# Patient Record
Sex: Female | Born: 1985 | Hispanic: Yes | Marital: Single | Smoking: Former smoker
Health system: Southern US, Community
[De-identification: ages and names within clinical notes are randomized; demographics above are authoritative.]

## PROBLEM LIST (undated history)

## (undated) ENCOUNTER — Ambulatory Visit: Admission: EM | Payer: Medicaid Other | Source: Home / Self Care

## (undated) DIAGNOSIS — N83209 Unspecified ovarian cyst, unspecified side: Secondary | ICD-10-CM

## (undated) HISTORY — PX: APPENDECTOMY: SHX54

---

## 2020-02-24 ENCOUNTER — Other Ambulatory Visit: Payer: Self-pay

## 2020-02-24 ENCOUNTER — Encounter: Payer: Self-pay | Admitting: Emergency Medicine

## 2020-02-24 ENCOUNTER — Ambulatory Visit
Admission: EM | Admit: 2020-02-24 | Discharge: 2020-02-24 | Disposition: A | Discharge status: Home / Self Care | Payer: Medicaid Other | Attending: Physician Assistant | Admitting: Physician Assistant

## 2020-02-24 DIAGNOSIS — N10 Acute tubulo-interstitial nephritis: Secondary | ICD-10-CM | POA: Insufficient documentation

## 2020-02-24 HISTORY — DX: Unspecified ovarian cyst, unspecified side: N83.209

## 2020-02-24 LAB — POCT URINALYSIS DIP (MANUAL ENTRY)
Bilirubin, UA: NEGATIVE
Glucose, UA: NEGATIVE mg/dL
Ketones, POC UA: NEGATIVE mg/dL
Nitrite, UA: POSITIVE — AB
Protein Ur, POC: 100 mg/dL — AB
Spec Grav, UA: 1.025 (ref 1.010–1.025)
Urobilinogen, UA: 0.2 E.U./dL
pH, UA: 7 (ref 5.0–8.0)

## 2020-02-24 LAB — POCT URINE PREGNANCY: Preg Test, Ur: NEGATIVE

## 2020-02-24 MED ORDER — KETOROLAC TROMETHAMINE 15 MG/ML IJ SOLN
15.0000 mg | Freq: Once | INTRAMUSCULAR | Status: AC
  Administered 2020-02-24: 15 mg via INTRAMUSCULAR

## 2020-02-24 MED ORDER — CEPHALEXIN 500 MG PO CAPS
500.0000 mg | ORAL_CAPSULE | Freq: Two times a day (BID) | ORAL | 0 refills | Status: DC
Start: 2020-02-24 — End: 2020-11-06

## 2020-02-24 NOTE — ED Triage Notes (Signed)
Abdominal pain started 5/15.  Patient has had diarrhea for 3-4 days.    Miscarriage while in [place]-05/2019 or 06/2019  Patient reports no period for the past 2 months-that episode was only one - two days of bleeding.    Denies burning with urination.    Home pregnancy tests are all negative.  Patient has pain in right lower back/flank

## 2020-02-24 NOTE — ED Provider Notes (Signed)
[facility]    CSN: [id number] Arrival date & time: 02/24/20  1146      History   Chief Complaint Chief Complaint  Patient presents with  . Abdominal Pain    HPI Grace Gray is a 34 y.o. female.   34 year old female comes in 1 month history of urinary symptoms, now with 4 day history of right back/abdominal pain. Has had urinary frequency, urgency without dysuria or hematuria.  Now with right flank pain that radiates to RLQ/suprapubic area.  Denies fever, chills, body aches.  Has had some diarrhea.  Denies nausea, vomiting.  Denies fever, chills, body aches.  Minimal vaginal discharge.  Denies itching.  Sexually active with one female partner, no condom use, no other birth control use. History of miscarriage 05/2019, has had irregular cycles since.      Past Medical History:  Diagnosis Date  . Ovarian cyst     There are no problems to display for this patient.   History reviewed. No pertinent surgical history.  OB History   No obstetric history on file.      Home Medications    Prior to Admission medications   Medication Sig Start Date End Date Taking? Authorizing Provider  cephALEXin (KEFLEX) 500 MG capsule Take 1 capsule (500 mg total) by mouth 2 (two) times daily. 02/24/20   Ok Edwards, PA-C    Family History History reviewed. No pertinent family history.  Social History Social History   Tobacco Use  . Smoking status: Not on file  Substance Use Topics  . Alcohol use: Not on file  . Drug use: Not on file     Allergies   Patient has no known allergies.   Review of Systems Review of Systems  Reason unable to perform ROS: See HPI as above.     Physical Exam Triage Vital Signs ED Triage Vitals  Enc Vitals Group     BP 02/24/20 1205 (!) 116/57     Pulse Rate 02/24/20 1205 92     Resp 02/24/20 1205 18     Temp 02/24/20 1205 98.6 F (37 C)     Temp Source 02/24/20 1205 Oral     SpO2 02/24/20 1205 98 %     Weight --    Height --      Head Circumference --      Peak Flow --      Pain Score 02/24/20 1220 8     Pain Loc --      Pain Edu? --      Excl. in [place]? --    No data found.  Updated Vital Signs BP (!) 116/57 (BP Location: Left Arm)   Pulse 92   Temp 98.6 F (37 C) (Oral)   Resp 18   LMP 02/10/2020 Comment: last 2 days  SpO2 98%   Physical Exam Constitutional:      General: She is not in acute distress.    Appearance: She is well-developed. She is not ill-appearing, toxic-appearing or diaphoretic.  HENT:     Head: Normocephalic and atraumatic.  Eyes:     Conjunctiva/sclera: Conjunctivae normal.     Pupils: Pupils are equal, round, and reactive to light.  Cardiovascular:     Rate and Rhythm: Normal rate and regular rhythm.  Pulmonary:     Effort: Pulmonary effort is normal. No respiratory distress.     Comments: LCTAB Abdominal:     General: Bowel sounds are normal.  Palpations: Abdomen is soft.     Tenderness: There is no abdominal tenderness. There is right CVA tenderness. There is no left CVA tenderness, guarding or rebound.  Musculoskeletal:     Cervical back: Normal range of motion and neck supple.  Skin:    General: Skin is warm and dry.  Neurological:     Mental Status: She is alert and oriented to person, place, and time.  Psychiatric:        Behavior: Behavior normal.        Judgment: Judgment normal.      UC Treatments / Results  Labs (all labs ordered are listed, but only abnormal results are displayed) Labs Reviewed  POCT URINALYSIS DIP (MANUAL ENTRY) - Abnormal; Notable for the following components:      Result Value   Clarity, UA cloudy (*)    Blood, UA small (*)    Protein Ur, POC =100 (*)    Nitrite, UA Positive (*)    Leukocytes, UA Moderate (2+) (*)    All other components within normal limits  URINE CULTURE  POCT URINE PREGNANCY    EKG   Radiology No results found.  Procedures Procedures (including critical care time)  Medications  Ordered in UC Medications  ketorolac (TORADOL) 15 MG/ML injection 15 mg (15 mg Intramuscular Given 02/24/20 1252)    Initial Impression / Assessment and Plan / UC Course  I have reviewed the triage vital signs and the nursing notes.  Pertinent labs & imaging results that were available during my care of the patient were reviewed by me and considered in my medical decision making (see chart for details).    History and exam most consistent with pyelonephritis.  Discussed cannot rule out urethral stone contributing to symptoms.  Abdomen soft, + BS, nontender to palpation.  Toradol injection given in office today for symptomatic management.  Keflex as directed.  Urine culture sent.  Push fluids.  Strict return precautions given.  Patient expresses understanding and agrees to plan.  Final Clinical Impressions(s) / UC Diagnoses   Final diagnoses:  Acute pyelonephritis   ED Prescriptions    Medication Sig Dispense Auth. Provider   cephALEXin (KEFLEX) 500 MG capsule Take 1 capsule (500 mg total) by mouth 2 (two) times daily. 20 capsule Ok Edwards, PA-C     PDMP not reviewed this encounter.   Ok Edwards, PA-C 02/24/20 1340

## 2020-02-24 NOTE — Discharge Instructions (Signed)
Your urine was positive for an urinary tract infection, combined with your symptoms, consistent with kidney infection. Start keflex as directed. Keep hydrated, urine should be clear to pale yellow in color. Monitor for any worsening of symptoms, fever, worsening abdominal pain, nausea/vomiting, go to the emergency department for further evaluation.

## 2020-02-26 LAB — URINE CULTURE: Culture: >=100000 — AB

## 2020-11-06 ENCOUNTER — Ambulatory Visit
Admission: EM | Admit: 2020-11-06 | Discharge: 2020-11-06 | Disposition: A | Discharge status: Home / Self Care | Payer: Medicaid Other

## 2020-11-06 ENCOUNTER — Encounter: Payer: Self-pay | Admitting: *Deleted

## 2020-11-06 ENCOUNTER — Other Ambulatory Visit: Payer: Self-pay

## 2020-11-06 ENCOUNTER — Ambulatory Visit (INDEPENDENT_AMBULATORY_CARE_PROVIDER_SITE_OTHER): Payer: Medicaid Other

## 2020-11-06 DIAGNOSIS — S62642A Nondisplaced fracture of proximal phalanx of right middle finger, initial encounter for closed fracture: Secondary | ICD-10-CM | POA: Diagnosis not present

## 2020-11-06 DIAGNOSIS — D1611 Benign neoplasm of short bones of right upper limb: Secondary | ICD-10-CM | POA: Diagnosis not present

## 2020-11-06 DIAGNOSIS — M79641 Pain in right hand: Secondary | ICD-10-CM

## 2020-11-06 DIAGNOSIS — R2231 Localized swelling, mass and lump, right upper limb: Secondary | ICD-10-CM

## 2020-11-06 NOTE — ED Triage Notes (Signed)
Pt reports climbing on a rock wall yesterday when she felt and heard a sudden "crack" in right hand.  Since then, c/o pain and swelling to right hand, extending into right middle finger.  RUE CMS intact to all digits.

## 2020-11-06 NOTE — ED Provider Notes (Signed)
[facility]    CSN: [id number] Arrival date & time: 11/06/20  1036      History   Chief Complaint Chief Complaint  Patient presents with  . Hand Pain    HPI Grace Gray is a 35 y.o. female  Presenting for right finger pain, swelling.  States this occurred yesterday: Climbing a rock wall when she felt a crack in her right hand.  Has had decreased ROM.  No numbness, deformity.  Past Medical History:  Diagnosis Date  . Ovarian cyst     There are no problems to display for this patient.   History reviewed. No pertinent surgical history.  OB History   No obstetric history on file.      Home Medications    Prior to Admission medications   Medication Sig Start Date End Date Taking? Authorizing Provider  IBUPROFEN PO Take by mouth.   Yes [provider]    Family History Family History  Problem Relation Age of Onset  . Diabetes Mother   . Diabetes Father   . Cancer Father     Social History Social History   Tobacco Use  . Smoking status: Former Research scientist (life sciences)  . Smokeless tobacco: Never Used  Vaping Use  . Vaping Use: Never used  Substance Use Topics  . Alcohol use: Yes    Comment: occasionally  . Drug use: Never     Allergies   Fish allergy   Review of Systems Review of Systems  Constitutional: Negative for fatigue and fever.  HENT: Negative for ear pain, sinus pain, sore throat and voice change.   Eyes: Negative for pain, redness and visual disturbance.  Respiratory: Negative for cough and shortness of breath.   Cardiovascular: Negative for chest pain and palpitations.  Gastrointestinal: Negative for abdominal pain, diarrhea and vomiting.  Musculoskeletal: Negative for arthralgias and myalgias.       Positive for right finger pain, swelling  Skin: Negative for rash and wound.  Neurological: Negative for syncope and headaches.     Physical Exam Triage Vital Signs ED Triage Vitals  Enc Vitals Group     BP 11/06/20  1047 (!) 102/57     Pulse Rate 11/06/20 1047 79     Resp 11/06/20 1047 18     Temp 11/06/20 1047 98.8 F (37.1 C)     Temp Source 11/06/20 1047 Oral     SpO2 11/06/20 1047 99 %     Weight --      Height --      Head Circumference --      Peak Flow --      Pain Score 11/06/20 1048 8     Pain Loc --      Pain Edu? --      Excl. in [place]? --    No data found.  Updated Vital Signs BP (!) 102/57   Pulse 79   Temp 98.8 F (37.1 C) (Oral)   Resp 18   LMP 10/12/2020 (Approximate)   SpO2 99%   Visual Acuity Right Eye Distance:   Left Eye Distance:   Bilateral Distance:    Right Eye Near:   Left Eye Near:    Bilateral Near:     Physical Exam Constitutional:      General: She is not in acute distress. HENT:     Head: Normocephalic and atraumatic.  Eyes:     General: No scleral icterus.    Pupils: Pupils are equal, round, and reactive to  light.  Cardiovascular:     Rate and Rhythm: Normal rate.  Pulmonary:     Effort: Pulmonary effort is normal.  Musculoskeletal:        General: Swelling and tenderness present. No deformity.     Comments: Swelling, tenderness of right third digit, MCP, PIP.  NVI  Skin:    Capillary Refill: Capillary refill takes less than 2 seconds.     Coloration: Skin is not jaundiced or pale.     Findings: No bruising.  Neurological:     General: No focal deficit present.     Mental Status: She is alert and oriented to person, place, and time.      UC Treatments / Results  Labs (all labs ordered are listed, but only abnormal results are displayed) Labs Reviewed - No data to display  EKG   Radiology DG Hand Complete Right  Result Date: 11/06/2020 CLINICAL DATA:  Injury to RIGHT hand, pain and swelling. EXAM: RIGHT HAND - COMPLETE 3+ VIEW COMPARISON:  None. FINDINGS: Benign-appearing well-marginated expansile lucent lesion at the base of the third proximal phalanx, involving the proximal half of the third proximal phalanx, most compatible  with enchondroma, less likely aneurysmal bone cyst. Minimally displaced acute-appearing fracture through the central aspect of this lesion. Underlying MCP joint is normally aligned. No other cortical irregularity or osseous lesion within the RIGHT hand. No other fracture line or displaced fracture fragment. Soft tissue swelling about the base of the third finger. Soft tissues about the RIGHT hand are otherwise unremarkable. IMPRESSION: 1. Benign-appearing well-marginated expansile lucent lesion at the base of the third proximal phalanx, most compatible with a benign enchondroma, less likely aneurysmal bone cyst. 2. Acute minimally displaced fracture within the central aspect of this benign-appearing bone lesion, again most compatible with a benign enchondroma. 3. Remainder of the RIGHT hand is normal. Electronically Signed   By: Franki Cabot M.D.   On: 11/06/2020 11:28    Procedures Procedures (including critical care time)  Medications Ordered in UC Medications - No data to display  Initial Impression / Assessment and Plan / UC Course  I have reviewed the triage vital signs and the nursing notes.  Pertinent labs & imaging results that were available during my care of the patient were reviewed by me and considered in my medical decision making (see chart for details).     X-ray as above, placed finger in splint, buddy taped, Ace wrap.  Patient will follow up with Ortho within the week.  Return precautions discussed, pt verbalized understanding and is agreeable to plan. Final Clinical Impressions(s) / UC Diagnoses   Final diagnoses:  Enchondroma of bone of hand, right  Closed nondisplaced fracture of proximal phalanx of right middle finger, initial encounter   Discharge Instructions   None    ED Prescriptions    None     PDMP not reviewed this encounter.   Hall[place], [place], [place] 11/06/20 1159

## 2020-11-08 ENCOUNTER — Encounter (HOSPITAL_BASED_OUTPATIENT_CLINIC_OR_DEPARTMENT_OTHER): Payer: Self-pay | Admitting: Orthopaedic Surgery

## 2020-11-08 ENCOUNTER — Other Ambulatory Visit: Payer: Self-pay

## 2020-11-08 DIAGNOSIS — S62619A Displaced fracture of proximal phalanx of unspecified finger, initial encounter for closed fracture: Secondary | ICD-10-CM | POA: Insufficient documentation

## 2020-11-08 DIAGNOSIS — D1610 Benign neoplasm of short bones of unspecified upper limb: Secondary | ICD-10-CM | POA: Insufficient documentation

## 2020-11-09 NOTE — Progress Notes (Signed)
Spoke with pt about getting a covid test for her upcoming surgery tomorrow, 11/10/20.  Pt stated she was not feeling well, and had called Dr. Shelbie Ammons office to try and reschedule surgery for another day next week.  Pt stated that Dr. Shelbie Ammons office would reschedule surgery.

## 2020-11-09 NOTE — Progress Notes (Signed)
Called Dr Shelbie Ammons office and left message on VM for Claiborne Billings that patient not feeling well and had called their office to reschedule surgery. Informed Claiborne Billings that we will drop case to depot for rescheduling.

## 2020-11-15 ENCOUNTER — Encounter (HOSPITAL_COMMUNITY): Payer: Self-pay | Admitting: Orthopaedic Surgery

## 2020-11-15 ENCOUNTER — Other Ambulatory Visit (HOSPITAL_COMMUNITY): Payer: Medicaid Other

## 2020-11-15 NOTE — Progress Notes (Signed)
EKG:  N/A CXR: N/A ECHO:  Denies Stress Test:  Denies Cardiac Cath:  DEnies  Fasting Blood Sugar-  N/A Checks Blood Sugar_N/A__ times a day  OSA/CPAP:  No  ASA/Blood Thinners:  No  Covid test 11/16/20  Anesthesia Review:  No  Patient denies shortness of breath, fever, cough, and chest pain at PAT appointment.  Patient verbalized understanding of instructions provided today at the PAT appointment.  Patient asked to review instructions at home and day of surgery.

## 2020-11-16 ENCOUNTER — Other Ambulatory Visit (HOSPITAL_COMMUNITY)
Admission: RE | Admit: 2020-11-16 | Discharge: 2020-11-16 | Disposition: A | Discharge status: Home / Self Care | Payer: Medicaid Other | Source: Ambulatory Visit | Attending: Orthopaedic Surgery | Admitting: Orthopaedic Surgery

## 2020-11-16 DIAGNOSIS — U071 COVID-19: Secondary | ICD-10-CM | POA: Diagnosis not present

## 2020-11-16 DIAGNOSIS — Z01812 Encounter for preprocedural laboratory examination: Secondary | ICD-10-CM | POA: Insufficient documentation

## 2020-11-16 LAB — SARS CORONAVIRUS 2 (TAT 6-24 HRS): SARS Coronavirus 2: POSITIVE — AB

## 2020-11-17 ENCOUNTER — Encounter (HOSPITAL_COMMUNITY)
Admission: RE | Disposition: A | Discharge status: Home / Self Care | Payer: Self-pay | Source: Home / Self Care | Attending: Orthopaedic Surgery

## 2020-11-17 ENCOUNTER — Encounter (HOSPITAL_COMMUNITY): Payer: Self-pay | Admitting: Certified Registered Nurse Anesthetist

## 2020-11-17 ENCOUNTER — Ambulatory Visit (HOSPITAL_COMMUNITY): Payer: Medicaid Other

## 2020-11-17 ENCOUNTER — Encounter (HOSPITAL_COMMUNITY): Payer: Self-pay | Admitting: Orthopaedic Surgery

## 2020-11-17 ENCOUNTER — Ambulatory Visit (HOSPITAL_COMMUNITY)
Admission: RE | Admit: 2020-11-17 | Discharge: 2020-11-17 | Disposition: A | Discharge status: Home / Self Care | Payer: Medicaid Other | Attending: Orthopaedic Surgery | Admitting: Orthopaedic Surgery

## 2020-11-17 ENCOUNTER — Other Ambulatory Visit: Payer: Self-pay

## 2020-11-17 DIAGNOSIS — S62619A Displaced fracture of proximal phalanx of unspecified finger, initial encounter for closed fracture: Secondary | ICD-10-CM | POA: Diagnosis present

## 2020-11-17 DIAGNOSIS — X58XXXA Exposure to other specified factors, initial encounter: Secondary | ICD-10-CM | POA: Diagnosis not present

## 2020-11-17 DIAGNOSIS — Y939 Activity, unspecified: Secondary | ICD-10-CM | POA: Diagnosis not present

## 2020-11-17 DIAGNOSIS — Z5309 Procedure and treatment not carried out because of other contraindication: Secondary | ICD-10-CM | POA: Insufficient documentation

## 2020-11-17 LAB — HCG, QUANTITATIVE, PREGNANCY: hCG, Beta Chain, Quant, S: 4380 m[IU]/mL — ABNORMAL HIGH (ref <5)

## 2020-11-17 LAB — SURGICAL PCR SCREEN
MRSA, PCR: NEGATIVE
Staphylococcus aureus: NEGATIVE

## 2020-11-17 LAB — POCT PREGNANCY, URINE: Preg Test, Ur: POSITIVE — AB

## 2020-11-17 SURGERY — OPEN REDUCTION INTERNAL FIXATION (ORIF) PROXIMAL PHALANX
Anesthesia: Monitor Anesthesia Care | Laterality: Right

## 2020-11-17 MED ORDER — LACTATED RINGERS IV SOLN: INTRAVENOUS | Status: DC

## 2020-11-17 MED ORDER — ORAL CARE MOUTH RINSE: 15.0000 mL | Freq: Once | OROMUCOSAL | Status: DC

## 2020-11-17 MED ORDER — ACETAMINOPHEN 500 MG PO TABS
1000.0000 mg | ORAL_TABLET | Freq: Once | ORAL | Status: DC
  Filled 2020-11-17: qty 2

## 2020-11-17 MED ORDER — CHLORHEXIDINE GLUCONATE 0.12 % MT SOLN
OROMUCOSAL | Status: AC
  Filled 2020-11-17: qty 15

## 2020-11-17 MED ORDER — CHLORHEXIDINE GLUCONATE 0.12 % MT SOLN: 15.0000 mL | Freq: Once | OROMUCOSAL | Status: DC

## 2020-11-17 MED ORDER — CEFAZOLIN SODIUM-DEXTROSE 2-4 GM/100ML-% IV SOLN
2.0000 g | INTRAVENOUS | Status: DC
  Filled 2020-11-17: qty 100

## 2020-11-17 MED ORDER — CHLORHEXIDINE GLUCONATE 4 % EX LIQD: 60.0000 mL | Freq: Once | CUTANEOUS | Status: DC

## 2020-11-17 MED ORDER — FENTANYL CITRATE (PF) 250 MCG/5ML IJ SOLN
INTRAMUSCULAR | Status: AC
  Filled 2020-11-17: qty 5

## 2020-11-17 MED ORDER — PROPOFOL 10 MG/ML IV BOLUS
INTRAVENOUS | Status: AC
  Filled 2020-11-17: qty 20

## 2020-11-17 MED ORDER — POVIDONE-IODINE 10 % EX SWAB: 2.0000 "application " | Freq: Once | CUTANEOUS | Status: DC

## 2020-11-17 NOTE — Anesthesia Preprocedure Evaluation (Deleted)
Anesthesia Evaluation    Reviewed: Allergy & Precautions, Patient's Chart, lab work & pertinent test results  Airway        Dental   Pulmonary former smoker,  COVID + on screen 11/16/20, asymptomatic. Surgeon wishes to proceed with case          Cardiovascular negative cardio ROS       Neuro/Psych negative neurological ROS  negative psych ROS   GI/Hepatic negative GI ROS, Neg liver ROS,   Endo/Other  Obesity BMI 31  Renal/GU negative Renal ROS  negative genitourinary   Musculoskeletal Closed fx right finger proximal phalanx   Abdominal   Peds  Hematology negative hematology ROS (+)   Anesthesia Other Findings   Reproductive/Obstetrics negative OB ROS                             Anesthesia Physical Anesthesia Plan  ASA: II  Anesthesia Plan: MAC and Regional   Post-op Pain Management:    Induction:   PONV Risk Score and Plan: 2 and Propofol infusion and TIVA  Airway Management Planned: Natural Airway and Simple Face Mask  Additional Equipment: None  Intra-op Plan:   Post-operative Plan:   Informed Consent:   Plan Discussed with:   Anesthesia Plan Comments:         Anesthesia Quick Evaluation

## 2020-11-17 NOTE — Progress Notes (Signed)
Positive urine preg results, Dr. Doroteo Glassman and Dr. Jeannie Fend spoke with patient about options in regards to going ahead with surgery or not. Surgery cancelled. Patient ambulated outside to main entrance A with RN.

## 2020-11-17 NOTE — Progress Notes (Signed)
Dr. Doroteo Glassman aware of POCT urine preg results, verbal orders received for quantitative lab.

## 2020-11-17 NOTE — Progress Notes (Addendum)
Left message for Dr. Jeannie Fend to return call in regards to covid test result.    Dr. Jeannie Fend aware and states that we will proceed with surgery today.    Patient aware of covid test results and will call short stay when she arrives to the hospital.  Patient states that she feels well.

## 2020-11-29 DIAGNOSIS — Z4789 Encounter for other orthopedic aftercare: Secondary | ICD-10-CM | POA: Insufficient documentation

## 2020-12-15 ENCOUNTER — Ambulatory Visit
Admission: EM | Admit: 2020-12-15 | Discharge: 2020-12-15 | Disposition: A | Discharge status: Home / Self Care | Payer: Medicaid Other

## 2020-12-15 DIAGNOSIS — O219 Vomiting of pregnancy, unspecified: Secondary | ICD-10-CM | POA: Diagnosis not present

## 2020-12-15 DIAGNOSIS — Z3A09 9 weeks gestation of pregnancy: Secondary | ICD-10-CM

## 2020-12-15 NOTE — Discharge Instructions (Signed)
Drink lots of fluids.  Small frequent sips.   Take unisom (1/2 to 1 tab) and Vitamin B6 (50mg ) for nausea and vomiting.  You can also try ginger ale/ginger candy and seabands.    Follow up with your OB/GYN.   Return or go to the Emergency Department if symptoms worsen or do not improve in the next few days.

## 2020-12-15 NOTE — ED Triage Notes (Signed)
Pt c/o vomiting since 11:30am today. States unable to keep anything down. States will be [redacted]wks pregnant tomorrow. States has not experienced any morning sickness yet. Denies eating anything different last night. Denies abdominal pain or vaginal bleeding.

## 2020-12-15 NOTE — ED Provider Notes (Signed)
[facility]    CSN: [id number] Arrival date & time: 12/15/20  1909      History   Chief Complaint Chief Complaint  Patient presents with  . Emesis    HPI Grace Gray is a 35 y.o. female.   Grace Gray is 35 year old G3P2 approximately [redacted] weeks pregnant presenting with nausea and vomiting that started today.  Reports starting cipro yesterday for UTI.  Reports multiple episodes of vomiting today.  Denies any abdominal pain, dysuria, pelvic pain, vaginal bleeding, or back pain.  Does have prenatal care.    ROS: As per HPI, all other pertinent ROS negative      Past Medical History:  Diagnosis Date  . Ovarian cyst     There are no problems to display for this patient.   Past Surgical History:  Procedure Laterality Date  . APPENDECTOMY      OB History    Gravida  1   Para      Term      Preterm      AB      Living        SAB      IAB      Ectopic      Multiple      Live Births               Home Medications    Prior to Admission medications   Medication Sig Start Date End Date Taking? Authorizing Provider  ciprofloxacin (CIPRO) 500 MG tablet Take 500 mg by mouth 2 (two) times daily.   Yes [provider]  Prenatal Vit-Fe Fumarate-FA (MULTIVITAMIN-PRENATAL) 27-0.8 MG TABS tablet Take 1 tablet by mouth daily at 12 noon.   Yes [provider]    Family History Family History  Problem Relation Age of Onset  . Diabetes Mother   . Diabetes Father   . Cancer Father     Social History Social History   Tobacco Use  . Smoking status: Former Research scientist (life sciences)  . Smokeless tobacco: Never Used  Vaping Use  . Vaping Use: Never used  Substance Use Topics  . Alcohol use: Not Currently    Comment: occasionally  . Drug use: Never     Allergies   Fish allergy   Review of Systems Review of Systems  Gastrointestinal: Positive for nausea and vomiting.  Genitourinary: Negative for flank pain, vaginal  bleeding, vaginal discharge and vaginal pain.     Physical Exam Triage Vital Signs ED Triage Vitals [12/15/20 1924]  Enc Vitals Group     BP 95/66     Pulse Rate 86     Resp 18     Temp 98.9 F (37.2 C)     Temp src      SpO2 98 %     Weight      Height      Head Circumference      Peak Flow      Pain Score 0     Pain Loc      Pain Edu?      Excl. in [place]?    No data found.  Updated Vital Signs BP (!) 99/54 (BP Location: Left Arm)   Pulse 86   Temp 98.9 F (37.2 C)   Resp 18   LMP 10/12/2020 (Exact Date)   SpO2 98%   Visual Acuity Right Eye Distance:   Left Eye Distance:   Bilateral Distance:    Right Eye Near:  Left Eye Near:    Bilateral Near:     Physical Exam Vitals and nursing note reviewed.  Constitutional:      General: She is not in acute distress.    Appearance: Normal appearance. She is not ill-appearing, toxic-appearing or diaphoretic.  HENT:     Head: Normocephalic and atraumatic.  Eyes:     Conjunctiva/sclera: Conjunctivae normal.  Cardiovascular:     Rate and Rhythm: Normal rate.     Pulses: Normal pulses.  Pulmonary:     Effort: Pulmonary effort is normal.     Breath sounds: Normal breath sounds.  Abdominal:     General: Abdomen is flat.     Tenderness: There is no abdominal tenderness. There is no right CVA tenderness, left CVA tenderness or guarding.  Musculoskeletal:        General: Normal range of motion.     Cervical back: Normal range of motion.  Skin:    General: Skin is warm and dry.  Neurological:     General: No focal deficit present.     Mental Status: She is alert and oriented to person, place, and time.  Psychiatric:        Mood and Affect: Mood normal.      UC Treatments / Results  Labs (all labs ordered are listed, but only abnormal results are displayed) Labs Reviewed - No data to display  EKG   Radiology No results found.  Procedures Procedures (including critical care time)  Medications Ordered  in UC Medications - No data to display  Initial Impression / Assessment and Plan / UC Course  I have reviewed the triage vital signs and the nursing notes.  Pertinent labs & imaging results that were available during my care of the patient were reviewed by me and considered in my medical decision making (see chart for details).     Vomiting During Pregnancy Assessment otherwise negative with no red flags or concerns  Encourage fluid intake (small frequent sips).  Given information on medication safe to take during pregnancy.  Recommend Unisom and Vit B6 for nausea.  Follow up with OBGYN  Final Clinical Impressions(s) / UC Diagnoses   Final diagnoses:  Vomiting during pregnancy  [redacted] weeks gestation of pregnancy     Discharge Instructions     Drink lots of fluids.  Small frequent sips.   Take unisom (1/2 to 1 tab) and Vitamin B6 (50mg ) for nausea and vomiting.  You can also try ginger ale/ginger candy and seabands.    Follow up with your OB/GYN.   Return or go to the Emergency Department if symptoms worsen or do not improve in the next few days.      ED Prescriptions    None     PDMP not reviewed this encounter.   Pearson Forster, NP 12/15/20 [phone]

## 2021-09-20 IMAGING — DX DG HAND COMPLETE 3+V*R*
3 series · 3 of 3 positions shown · non-contrast
Comparison: None.

CLINICAL DATA: Injury to RIGHT hand, pain and swelling.

EXAM:
RIGHT HAND - COMPLETE 3+ VIEW

[hand pa]
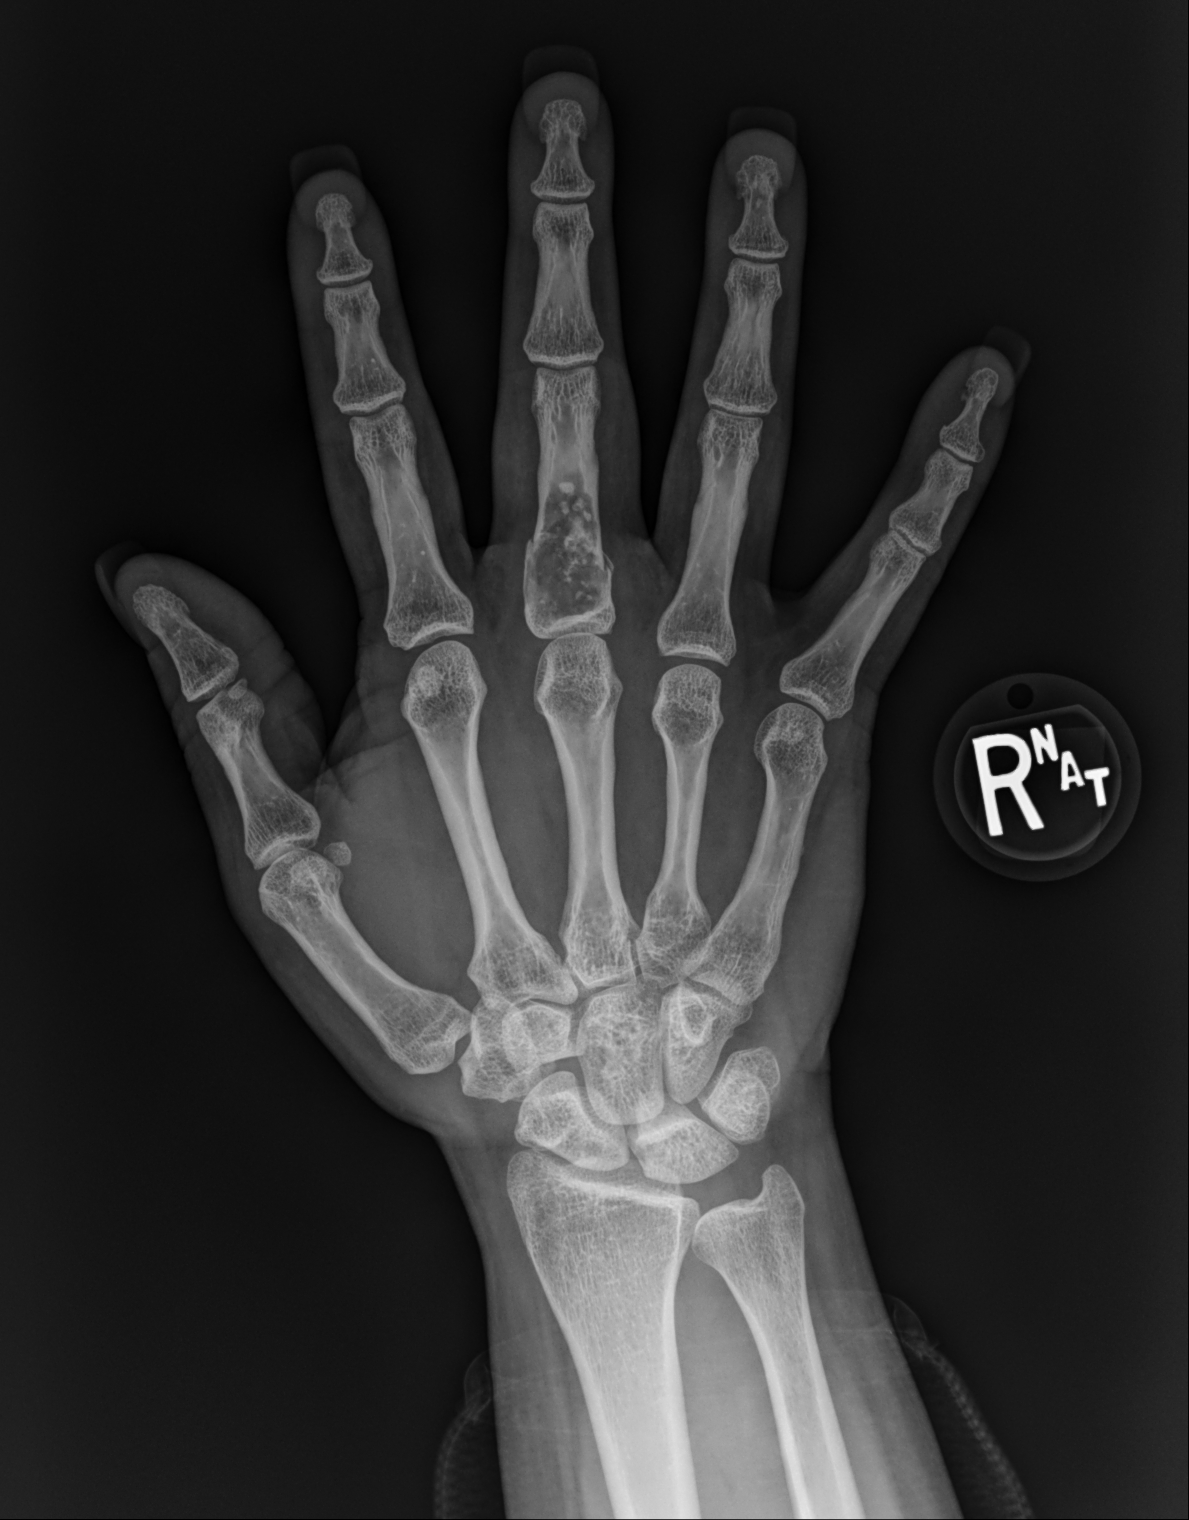

[hand mlo]
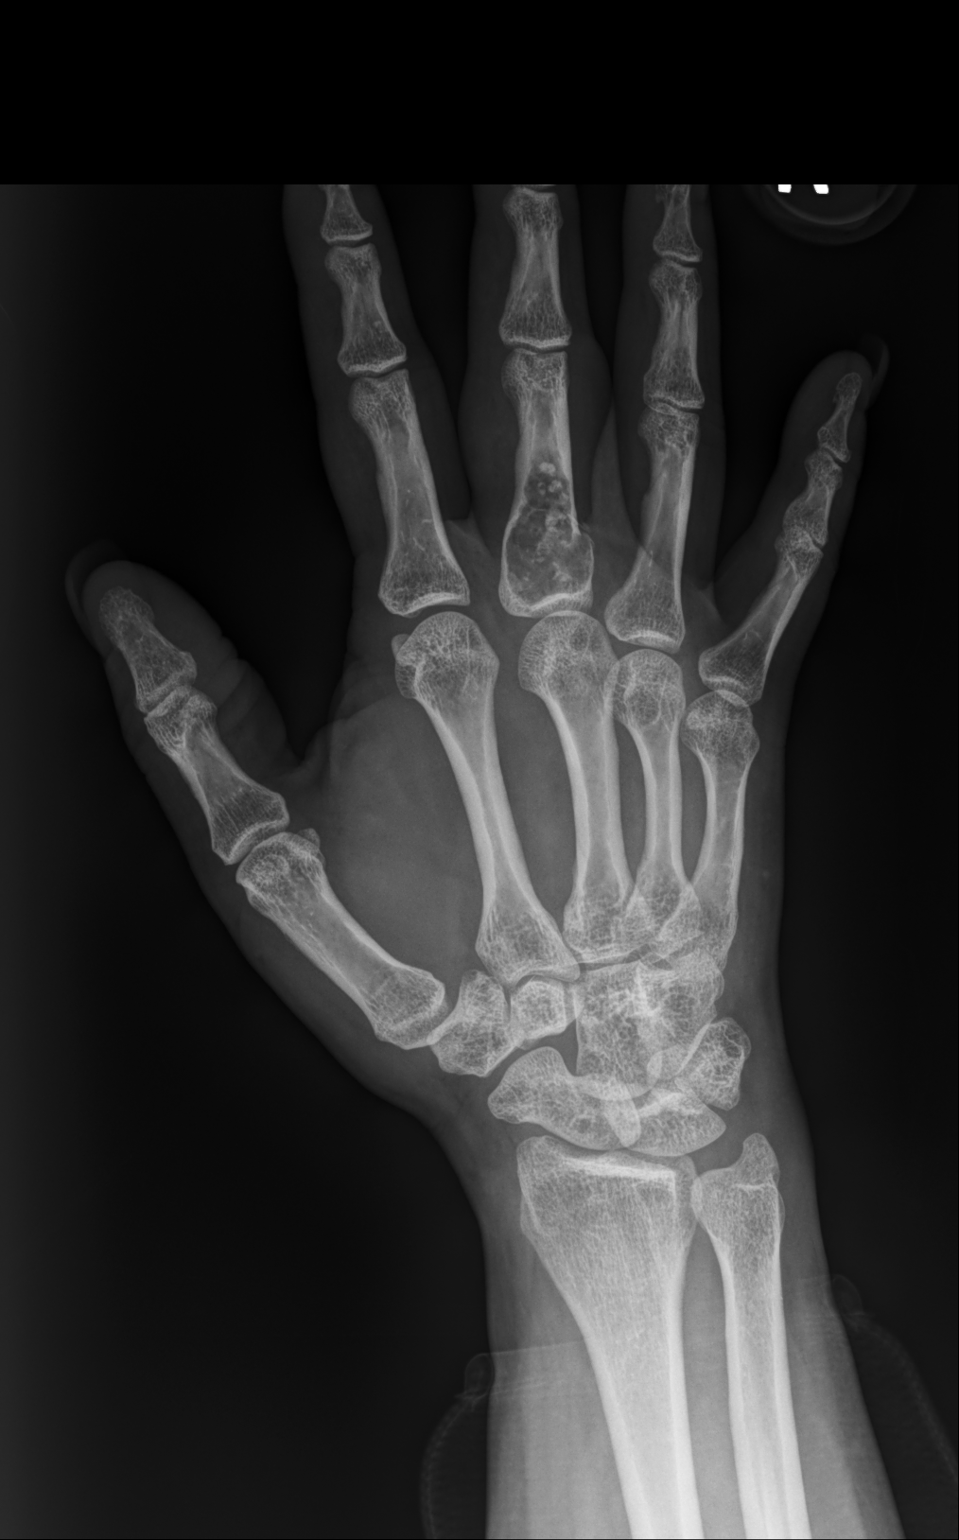

[hand lat]
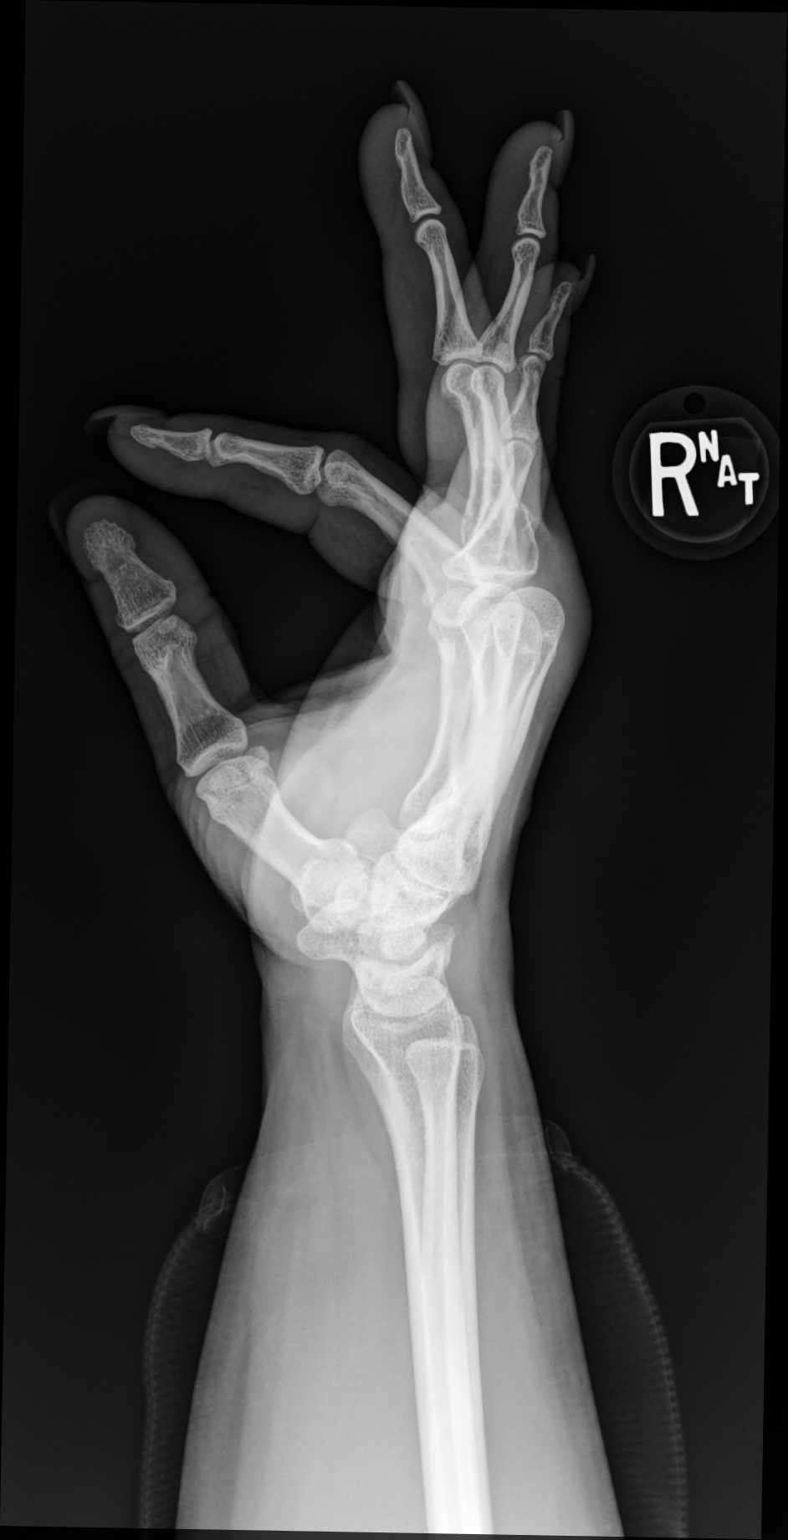

[3 of 3 positions shown; findings below may reference images not displayed]

FINDINGS: Benign-appearing well-marginated expansile lucent lesion at the base
of the third proximal phalanx, involving the proximal half of the
third proximal phalanx, most compatible with enchondroma, less
likely aneurysmal bone cyst. Minimally displaced acute-appearing
fracture through the central aspect of this lesion. Underlying MCP
joint is normally aligned.

No other cortical irregularity or osseous lesion within the RIGHT
hand. No other fracture line or displaced fracture fragment. Soft
tissue swelling about the base of the third finger. Soft tissues
about the RIGHT hand are otherwise unremarkable.
IMPRESSION: 1. Benign-appearing well-marginated expansile lucent lesion at the
base of the third proximal phalanx, most compatible with a benign
enchondroma, less likely aneurysmal bone cyst.
2. Acute minimally displaced fracture within the central aspect of
this benign-appearing bone lesion, again most compatible with a
benign enchondroma.
3. Remainder of the RIGHT hand is normal.

## 2022-03-18 ENCOUNTER — Encounter: Payer: Self-pay | Admitting: Emergency Medicine

## 2022-03-18 ENCOUNTER — Ambulatory Visit
Admission: EM | Admit: 2022-03-18 | Discharge: 2022-03-18 | Disposition: A | Discharge status: Home / Self Care | Payer: Medicaid Other | Attending: Emergency Medicine | Admitting: Emergency Medicine

## 2022-03-18 ENCOUNTER — Other Ambulatory Visit: Payer: Self-pay

## 2022-03-18 DIAGNOSIS — Z20822 Contact with and (suspected) exposure to covid-19: Secondary | ICD-10-CM | POA: Diagnosis not present

## 2022-03-18 DIAGNOSIS — B349 Viral infection, unspecified: Secondary | ICD-10-CM

## 2022-03-18 NOTE — ED Provider Notes (Signed)
[facility]    CSN: [id number] Arrival date & time: 03/18/22  1249    HISTORY   Chief Complaint  Patient presents with   Vomiting   Sore Throat   HPI Grace Gray is a 36 y.o. female. Patient presents urgent care today complaining of sore throat, chills with nausea vomiting and diarrhea that began last night.  Patient reports a single episode of vomiting.  Patient states she is otherwise feeling well at this time.  Patient has normal vital signs on arrival today.  Patient states she needs a COVID test and a note to return to work.  Patient states not any medication for symptoms.  The history is provided by the patient.   Past Medical History:  Diagnosis Date   Ovarian cyst    There are no problems to display for this patient.  Past Surgical History:  Procedure Laterality Date   APPENDECTOMY     OB History     Gravida  1   Para      Term      Preterm      AB      Living         SAB      IAB      Ectopic      Multiple      Live Births             Home Medications    Prior to Admission medications   Medication Sig Start Date End Date Taking? Authorizing Provider  ciprofloxacin (CIPRO) 500 MG tablet Take 500 mg by mouth 2 (two) times daily.    [provider]  Prenatal Vit-Fe Fumarate-FA (MULTIVITAMIN-PRENATAL) 27-0.8 MG TABS tablet Take 1 tablet by mouth daily at 12 noon.    [provider]   Family History Family History  Problem Relation Age of Onset   Diabetes Mother    Diabetes Father    Cancer Father    Social History Social History   Tobacco Use   Smoking status: Former   Smokeless tobacco: Never  Scientific laboratory technician Use: Never used  Substance Use Topics   Alcohol use: Not Currently    Comment: occasionally   Drug use: Never   Allergies   Fish allergy  Review of Systems Review of Systems Pertinent findings noted in history of present illness.   Physical Exam Triage Vital Signs ED  Triage Vitals  Enc Vitals Group     BP 08/04/21 0827 (!) 147/82     Pulse Rate 08/04/21 0827 72     Resp 08/04/21 0827 18     Temp 08/04/21 0827 98.3 F (36.8 C)     Temp Source 08/04/21 0827 Oral     SpO2 08/04/21 0827 98 %     Weight --      Height --      Head Circumference --      Peak Flow --      Pain Score 08/04/21 0826 5     Pain Loc --      Pain Edu? --      Excl. in [place]? --   No data found.  Updated Vital Signs BP 114/71 (BP Location: Left Arm)   Pulse 82   Temp 98.3 F (36.8 C) (Oral)   Resp 18   SpO2 98%   Physical Exam Vitals and nursing note reviewed.  Constitutional:      General: She is not in acute distress.  Appearance: Normal appearance. She is not ill-appearing.  HENT:     Head: Normocephalic and atraumatic.     Salivary Glands: Right salivary gland is not diffusely enlarged or tender. Left salivary gland is not diffusely enlarged or tender.     Right Ear: Tympanic membrane, ear canal and external ear normal. No drainage. No middle ear effusion. There is no impacted cerumen. Tympanic membrane is not erythematous or bulging.     Left Ear: Tympanic membrane, ear canal and external ear normal. No drainage.  No middle ear effusion. There is no impacted cerumen. Tympanic membrane is not erythematous or bulging.     Nose: Nose normal. No nasal deformity, septal deviation, mucosal edema, congestion or rhinorrhea.     Right Turbinates: Not enlarged, swollen or pale.     Left Turbinates: Not enlarged, swollen or pale.     Right Sinus: No maxillary sinus tenderness or frontal sinus tenderness.     Left Sinus: No maxillary sinus tenderness or frontal sinus tenderness.     Mouth/Throat:     Lips: Pink. No lesions.     Mouth: Mucous membranes are moist. No oral lesions.     Pharynx: Oropharynx is clear. Uvula midline. No posterior oropharyngeal erythema or uvula swelling.     Tonsils: No tonsillar exudate. 0 on the right. 0 on the left.  Eyes:     General: Lids  are normal.        Right eye: No discharge.        Left eye: No discharge.     Extraocular Movements: Extraocular movements intact.     Conjunctiva/sclera: Conjunctivae normal.     Right eye: Right conjunctiva is not injected.     Left eye: Left conjunctiva is not injected.  Neck:     Trachea: Trachea and phonation normal.  Cardiovascular:     Rate and Rhythm: Normal rate and regular rhythm.     Pulses: Normal pulses.     Heart sounds: Normal heart sounds. No murmur heard.    No friction rub. No gallop.  Pulmonary:     Effort: Pulmonary effort is normal. No accessory muscle usage, prolonged expiration or respiratory distress.     Breath sounds: Normal breath sounds. No stridor, decreased air movement or transmitted upper airway sounds. No decreased breath sounds, wheezing, rhonchi or rales.  Chest:     Chest wall: No tenderness.  Musculoskeletal:        General: Normal range of motion.     Cervical back: Normal range of motion and neck supple. Normal range of motion.  Lymphadenopathy:     Cervical: No cervical adenopathy.  Skin:    General: Skin is warm and dry.     Findings: No erythema or rash.  Neurological:     General: No focal deficit present.     Mental Status: She is alert and oriented to person, place, and time.  Psychiatric:        Mood and Affect: Mood normal.        Behavior: Behavior normal.     Visual Acuity Right Eye Distance:   Left Eye Distance:   Bilateral Distance:    Right Eye Near:   Left Eye Near:    Bilateral Near:     UC Couse / Diagnostics / Procedures:    EKG  Radiology No results found.  Procedures Procedures (including critical care time)  UC Diagnoses / Final Clinical Impressions(s)   I have reviewed the triage vital signs and  the nursing notes.  Pertinent labs & imaging results that were available during my care of the patient were reviewed by me and considered in my medical decision making (see chart for details).   Final  diagnoses:  Encounter for screening laboratory testing for COVID-19 virus  Viral illness   Conservative care recommended.  COVID test pending.  Patient would be a good candidate for Paxlovid if her test is positive.  Return precautions advised.  Note provided for work.  ED Prescriptions   None    PDMP not reviewed this encounter.  Pending results:  Labs Reviewed  NOVEL CORONAVIRUS, NAA    Medications Ordered in UC: Medications - No data to display  Disposition Upon Discharge:  Condition: stable for discharge home Home: take medications as prescribed; routine discharge instructions as discussed; follow up as advised.  Patient presented with an acute illness with associated systemic symptoms and significant discomfort requiring urgent management. In my opinion, this is a condition that a prudent lay person (someone who possesses an average knowledge of health and medicine) may potentially expect to result in complications if not addressed urgently such as respiratory distress, impairment of bodily function or dysfunction of bodily organs.   Routine symptom specific, illness specific and/or disease specific instructions were discussed with the patient and/or caregiver at length.   As such, the patient has been evaluated and assessed, work-up was performed and treatment was provided in alignment with urgent care protocols and evidence based medicine.  Patient/parent/caregiver has been advised that the patient may require follow up for further testing and treatment if the symptoms continue in spite of treatment, as clinically indicated and appropriate.  If the patient was tested for COVID-19, Influenza and/or RSV, then the patient/parent/guardian was advised to isolate at home pending the results of his/her diagnostic coronavirus test and potentially longer if they're positive. I have also advised pt that if his/her COVID-19 test returns positive, it's recommended to self-isolate for at  least 10 days after symptoms first appeared AND until fever-free for 24 hours without fever reducer AND other symptoms have improved or resolved. Discussed self-isolation recommendations as well as instructions for household member/close contacts as per the [facility] and [place] DHHS, and also gave patient the [place] packet with this information.  Patient/parent/caregiver has been advised to return to the [facility] or PCP in 3-5 days if no better; to PCP or the Emergency Department if new signs and symptoms develop, or if the current signs or symptoms continue to change or worsen for further workup, evaluation and treatment as clinically indicated and appropriate  The patient will follow up with their current PCP if and as advised. If the patient does not currently have a PCP we will assist them in obtaining one.   The patient may need specialty follow up if the symptoms continue, in spite of conservative treatment and management, for further workup, evaluation, consultation and treatment as clinically indicated and appropriate.  Patient/parent/caregiver verbalized understanding and agreement of plan as discussed.  All questions were addressed during visit.  Please see discharge instructions below for further details of plan.  Discharge Instructions:   Discharge Instructions      Your symptoms and physical exam findings are concerning for a viral respiratory infection.     You were tested for COVID-19 today.  The result of your COVID-19 test will be posted to your MyChart once it is complete, typically this takes 36 to 48 hours.  If your COVID-19 test is positive, please advise the  nurse whether you are interested in taking the recommended antiviral for COVID-19 called Paxlovid.   Please see the list below for recommended medications, dosages and frequencies to provide relief of your current symptoms:    Advil, Motrin (ibuprofen): This is a good anti-inflammatory medication which addresses aches, pains and  inflammation of the upper airways that causes sinus and nasal congestion as well as in the lower airways which makes your cough feel tight and sometimes burn.  I recommend that you take between 400 to 600 mg every 6-8 hours as needed.      Tylenol (acetaminophen): This is a good fever reducer.  If your body temperature rises above 101.5 as measured with a thermometer, it is recommended that you take 1,000 mg every 8 hours until your temperature falls below 101.5, please not take more than 3,000 mg of acetaminophen either as a separate medication or as in ingredient in an over-the-counter cold/flu preparation within a 24-hour period.      Chloraseptic Throat Spray: Spray 5 sprays into affected area every 2 hours, hold for 15 seconds and either swallow or spit it out.  This is a excellent numbing medication because it is a spray, you can put it right where you needed and so sucking on a lozenge and numbing your entire mouth.      Please follow-up within the next 5-7 days either with your primary care provider or urgent care if your symptoms do not resolve.  If you do not have a primary care provider, we will assist you in finding one.   Thank you for visiting urgent care today.  We appreciate the opportunity to participate in your care.     This office note has been dictated using Museum/gallery curator.  Unfortunately, and despite my best efforts, this method of dictation can sometimes lead to occasional typographical or grammatical errors.  I apologize in advance if this occurs.     Lynden Oxford Scales, PA-C 03/19/22 1252

## 2022-03-18 NOTE — ED Triage Notes (Signed)
Pt here for sore throat and chills with N/V/D last night

## 2022-03-18 NOTE — Discharge Instructions (Signed)
Your symptoms and physical exam findings are concerning for a viral respiratory infection.     You were tested for COVID-19 today.  The result of your COVID-19 test will be posted to your MyChart once it is complete, typically this takes 36 to 48 hours.  If your COVID-19 test is positive, please advise the nurse whether you are interested in taking the recommended antiviral for COVID-19 called Paxlovid.   Please see the list below for recommended medications, dosages and frequencies to provide relief of your current symptoms:    Advil, Motrin (ibuprofen): This is a good anti-inflammatory medication which addresses aches, pains and inflammation of the upper airways that causes sinus and nasal congestion as well as in the lower airways which makes your cough feel tight and sometimes burn.  I recommend that you take between 400 to 600 mg every 6-8 hours as needed.      Tylenol (acetaminophen): This is a good fever reducer.  If your body temperature rises above 101.5 as measured with a thermometer, it is recommended that you take 1,000 mg every 8 hours until your temperature falls below 101.5, please not take more than 3,000 mg of acetaminophen either as a separate medication or as in ingredient in an over-the-counter cold/flu preparation within a 24-hour period.      Chloraseptic Throat Spray: Spray 5 sprays into affected area every 2 hours, hold for 15 seconds and either swallow or spit it out.  This is a excellent numbing medication because it is a spray, you can put it right where you needed and so sucking on a lozenge and numbing your entire mouth.      Please follow-up within the next 5-7 days either with your primary care provider or urgent care if your symptoms do not resolve.  If you do not have a primary care provider, we will assist you in finding one.   Thank you for visiting urgent care today.  We appreciate the opportunity to participate in your care.

## 2022-03-19 LAB — NOVEL CORONAVIRUS, NAA: SARS-CoV-2, NAA: NOT DETECTED

## 2022-08-08 ENCOUNTER — Ambulatory Visit
Admission: EM | Admit: 2022-08-08 | Discharge: 2022-08-08 | Disposition: A | Discharge status: Home / Self Care | Payer: Medicaid Other | Attending: Physician Assistant | Admitting: Physician Assistant

## 2022-08-08 DIAGNOSIS — H1031 Unspecified acute conjunctivitis, right eye: Secondary | ICD-10-CM

## 2022-08-08 MED ORDER — POLYMYXIN B-TRIMETHOPRIM 10000-0.1 UNIT/ML-% OP SOLN: 1.0000 [drp] | OPHTHALMIC | 0 refills | Status: AC

## 2022-08-08 NOTE — ED Provider Notes (Signed)
[facility]    CSN: [id number] Arrival date & time: 08/08/22  1237      History   Chief Complaint Chief Complaint  Patient presents with   Conjunctivitis    HPI Grace Gray is a 36 y.o. female.   Patient here today with bilateral eye irritation that started this morning.  Her daughter and son are also here for upper respiratory issues, and daughter has pinkeye as well.  She has not had any drainage from her eye.  She does not report pain but notes the eye is itchy.  She has not had treatment for symptoms. She denies fever.   The history is provided by the patient.  Conjunctivitis Pertinent negatives include no abdominal pain.    Past Medical History:  Diagnosis Date   Ovarian cyst     There are no problems to display for this patient.   Past Surgical History:  Procedure Laterality Date   APPENDECTOMY      OB History     Gravida  1   Para      Term      Preterm      AB      Living         SAB      IAB      Ectopic      Multiple      Live Births               Home Medications    Prior to Admission medications   Medication Sig Start Date End Date Taking? Authorizing Provider  trimethoprim-polymyxin b (POLYTRIM) ophthalmic solution Place 1 drop into the right eye every 4 (four) hours for 7 days. 08/08/22 08/15/22 Yes Francene Finders, PA-C  ciprofloxacin (CIPRO) 500 MG tablet Take 500 mg by mouth 2 (two) times daily.    [provider]  Prenatal Vit-Fe Fumarate-FA (MULTIVITAMIN-PRENATAL) 27-0.8 MG TABS tablet Take 1 tablet by mouth daily at 12 noon.    [provider]    Family History Family History  Problem Relation Age of Onset   Diabetes Mother    Diabetes Father    Cancer Father     Social History Social History   Tobacco Use   Smoking status: Former   Smokeless tobacco: Never  Scientific laboratory technician Use: Never used  Substance Use Topics   Alcohol use: Not Currently    Comment:  occasionally   Drug use: Never     Allergies   Fish allergy   Review of Systems Review of Systems  Constitutional:  Negative for chills and fever.  Eyes:  Positive for redness. Negative for discharge.  Gastrointestinal:  Negative for abdominal pain, nausea and vomiting.     Physical Exam Triage Vital Signs ED Triage Vitals  Enc Vitals Group     BP 08/08/22 1428 100/63     Pulse Rate 08/08/22 1428 72     Resp 08/08/22 1428 18     Temp 08/08/22 1428 97.9 F (36.6 C)     Temp Source 08/08/22 1428 Oral     SpO2 08/08/22 1428 97 %     Weight --      Height --      Head Circumference --      Peak Flow --      Pain Score 08/08/22 1435 4     Pain Loc --      Pain Edu? --      Excl. in  [place]? --    No data found.  Updated Vital Signs BP 100/63 (BP Location: Right Arm)   Pulse 72   Temp 97.9 F (36.6 C) (Oral)   Resp 18   LMP 08/07/2022   SpO2 97%   Visual Acuity Right Eye Distance:   Left Eye Distance:   Bilateral Distance:    Right Eye Near:   Left Eye Near:    Bilateral Near:     Physical Exam Vitals and nursing note reviewed.  Constitutional:      General: She is not in acute distress.    Appearance: Normal appearance. She is not ill-appearing.  HENT:     Head: Normocephalic and atraumatic.  Eyes:     Comments: Mild injection noted to right eye, left eye appears normal  Cardiovascular:     Rate and Rhythm: Normal rate.  Pulmonary:     Effort: Pulmonary effort is normal.  Neurological:     Mental Status: She is alert.  Psychiatric:        Mood and Affect: Mood normal.        Behavior: Behavior normal.        Thought Content: Thought content normal.      UC Treatments / Results  Labs (all labs ordered are listed, but only abnormal results are displayed) Labs Reviewed - No data to display  EKG   Radiology No results found.  Procedures Procedures (including critical care time)  Medications Ordered in UC Medications - No data to  display  Initial Impression / Assessment and Plan / UC Course  I have reviewed the triage vital signs and the nursing notes.  Pertinent labs & imaging results that were available during my care of the patient were reviewed by me and considered in my medical decision making (see chart for details).    Given daughter with suspected conjunctivitis will treat patient as same. Encouraged follow up with any further concerns or worsening symptoms.   Final Clinical Impressions(s) / UC Diagnoses   Final diagnoses:  Acute conjunctivitis of right eye, unspecified acute conjunctivitis type   Discharge Instructions   None    ED Prescriptions     Medication Sig Dispense Auth. Provider   trimethoprim-polymyxin b (POLYTRIM) ophthalmic solution Place 1 drop into the right eye every 4 (four) hours for 7 days. 10 mL Francene Finders, PA-C      PDMP not reviewed this encounter.   Francene Finders, PA-C 08/08/22 1526

## 2022-08-08 NOTE — ED Triage Notes (Signed)
Pt presents with bilateral eye irritation since this morning.

## 2023-06-07 ENCOUNTER — Ambulatory Visit
Admission: EM | Admit: 2023-06-07 | Discharge: 2023-06-07 | Disposition: A | Discharge status: Home / Self Care | Payer: Medicaid Other | Attending: Physician Assistant | Admitting: Physician Assistant

## 2023-06-07 DIAGNOSIS — R059 Cough, unspecified: Secondary | ICD-10-CM | POA: Diagnosis not present

## 2023-06-07 DIAGNOSIS — Z20822 Contact with and (suspected) exposure to covid-19: Secondary | ICD-10-CM

## 2023-06-07 DIAGNOSIS — U071 COVID-19: Secondary | ICD-10-CM | POA: Diagnosis not present

## 2023-06-07 NOTE — ED Triage Notes (Signed)
"  I have a sore throat since Monday, then stuffy nose, Fever yesterday up to 101.8 with occasional sob at times". Possible exposure to COVID19.

## 2023-06-08 ENCOUNTER — Encounter: Payer: Self-pay | Admitting: Physician Assistant

## 2023-06-08 LAB — SARS CORONAVIRUS 2 (TAT 6-24 HRS): SARS Coronavirus 2: POSITIVE — AB

## 2023-06-08 NOTE — ED Provider Notes (Signed)
[facility]    CSN: [id number] Arrival date & time: 06/07/23  1522      History   Chief Complaint Chief Complaint  Patient presents with   Cough   COVID19 Testng    Request    HPI Grace Gray is a 37 y.o. female.   Patient here today for evaluation of sore throat that she has had the last 5 days.  She states she has had some congestion and fever up to 101.8.  She notes occasional shortness of breath at times with cough.  She is unsure about possible exposure to COVID.  She denies any vomiting or diarrhea.  The history is provided by the patient.  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 Authorizing Provider  ibuprofen (ADVIL) 600 MG tablet Take 1 tablet by mouth every 6 (six) hours. 07/16/21  Yes [provider]  sertraline (ZOLOFT) 25 MG tablet Take 1 tablet by mouth daily. 08/03/21  Yes [provider]  ciprofloxacin (CIPRO) 500 MG tablet Take 500 mg by mouth 2 (two) times daily.    [provider]  Prenatal Vit-Fe Fumarate-FA (MULTIVITAMIN-PRENATAL) 27-0.8 MG TABS tablet Take 1 tablet by mouth daily at 12 noon.    [provider]    Family History Family History  Problem  Relation Age of Onset   Diabetes Mother    Diabetes Father    Cancer Father     Social History Social History   Tobacco Use   Smoking status: Former   Smokeless tobacco: Never  Advertising account planner   Vaping status: Never Used  Substance Use Topics   Alcohol use: Not Currently    Comment: occasionally   Drug use: Never     Allergies   Fish allergy   Review of Systems Review of Systems  Constitutional:  Positive for fever.  HENT:  Positive for congestion and sore throat. Negative for ear pain.   Eyes:  Negative for discharge and redness.  Respiratory:  Positive for cough. Negative for wheezing.   Gastrointestinal:  Negative for abdominal pain, diarrhea, nausea and vomiting.     Physical Exam Triage Vital Signs ED Triage Vitals  Encounter Vitals Group     BP 06/07/23 1548 97/63     Systolic BP Percentile --      Diastolic BP Percentile --      Pulse Rate  06/07/23 1548 81     Resp 06/07/23 1548 18     Temp 06/07/23 1548 98.5 F (36.9 C)     Temp Source 06/07/23 1548 Oral     SpO2 06/07/23 1548 98 %     Weight 06/07/23 1546 164 lb (74.4 kg)     Height 06/07/23 1546 5\' 1"  (1.549 m)     Head Circumference --      Peak Flow --      Pain Score 06/07/23 1543 0     Pain Loc --      Pain Education --      Exclude from Growth Chart --    No data found.  Updated Vital Signs BP 97/63 (BP Location: Left Arm)   Pulse 81   Temp 98.5 F (36.9 C) (Oral)   Resp 18   Ht 5\' 1"  (1.549 m)   Wt 164 lb (74.4 kg)   LMP 05/21/2023 (Exact Date)   SpO2 98%   BMI 30.99 kg/m     Physical Exam Vitals and nursing note reviewed.  Constitutional:      General: She is not in acute distress.    Appearance: Normal appearance. She is not ill-appearing.  HENT:     Head: Normocephalic and atraumatic.     Nose: Congestion present.     Mouth/Throat:     Mouth: Mucous membranes are moist.     Pharynx: Posterior oropharyngeal erythema present. No oropharyngeal exudate.  Eyes:      Conjunctiva/sclera: Conjunctivae normal.  Cardiovascular:     Rate and Rhythm: Normal rate and regular rhythm.     Heart sounds: Normal heart sounds. No murmur heard. Pulmonary:     Effort: Pulmonary effort is normal. No respiratory distress.     Breath sounds: Normal breath sounds. No wheezing, rhonchi or rales.  Skin:    General: Skin is warm and dry.  Neurological:     Mental Status: She is alert.  Psychiatric:        Mood and Affect: Mood normal.        Thought Content: Thought content normal.      UC Treatments / Results  Labs (all labs ordered are listed, but only abnormal results are displayed) Labs Reviewed  SARS CORONAVIRUS 2 (TAT 6-24 HRS) - Abnormal; Notable for the following components:      Result Value   SARS Coronavirus 2 POSITIVE (*)    All other components within normal limits    EKG   Radiology No results found.  Procedures Procedures (including critical care time)  Medications Ordered in UC Medications - No data to display  Initial Impression / Assessment and Plan / UC Course  I have reviewed the triage vital signs and the nursing notes.  Pertinent labs & imaging results that were available during my care of the patient were reviewed by me and considered in my medical decision making (see chart for details).    COVID screening ordered.  Will await results further recommendation.  Advised symptomatic treatment and increase fluids and rest.  Recommend follow-up with any further concerns or worsening symptoms.  Final Clinical Impressions(s) / UC Diagnoses   Final diagnoses:  Exposure to COVID-19 virus   Discharge Instructions   None    ED Prescriptions   None    PDMP not reviewed this encounter.   Tomi Bamberger, PA-C 06/08/23 [phone]

## 2023-09-23 ENCOUNTER — Ambulatory Visit: Payer: Self-pay
